# Patient Record
Sex: Female | Born: 1965 | Race: White | Hispanic: No | Marital: Married | Smoking: Current every day smoker
Health system: Southern US, Community
[De-identification: ages and names within clinical notes are randomized; demographics above are authoritative.]

## PROBLEM LIST (undated history)

## (undated) DIAGNOSIS — R569 Unspecified convulsions: Secondary | ICD-10-CM

## (undated) DIAGNOSIS — M069 Rheumatoid arthritis, unspecified: Secondary | ICD-10-CM

## (undated) DIAGNOSIS — M329 Systemic lupus erythematosus, unspecified: Secondary | ICD-10-CM

## (undated) DIAGNOSIS — M3500 Sicca syndrome, unspecified: Secondary | ICD-10-CM

## (undated) HISTORY — PX: ABDOMINAL HYSTERECTOMY: SHX81

## (undated) HISTORY — PX: OVARIAN CYST REMOVAL: SHX89

## (undated) HISTORY — PX: APPENDECTOMY: SHX54

---

## 2013-06-27 ENCOUNTER — Emergency Department (HOSPITAL_BASED_OUTPATIENT_CLINIC_OR_DEPARTMENT_OTHER)
Admission: EM | Admit: 2013-06-27 | Discharge: 2013-06-27 | Disposition: A | Discharge status: Home / Self Care | Payer: 59 | Attending: Emergency Medicine | Admitting: Emergency Medicine

## 2013-06-27 ENCOUNTER — Encounter (HOSPITAL_BASED_OUTPATIENT_CLINIC_OR_DEPARTMENT_OTHER): Payer: Self-pay | Admitting: Emergency Medicine

## 2013-06-27 DIAGNOSIS — M545 Low back pain, unspecified: Secondary | ICD-10-CM | POA: Insufficient documentation

## 2013-06-27 DIAGNOSIS — M549 Dorsalgia, unspecified: Secondary | ICD-10-CM

## 2013-06-27 DIAGNOSIS — G40909 Epilepsy, unspecified, not intractable, without status epilepticus: Secondary | ICD-10-CM | POA: Insufficient documentation

## 2013-06-27 DIAGNOSIS — F17200 Nicotine dependence, unspecified, uncomplicated: Secondary | ICD-10-CM | POA: Insufficient documentation

## 2013-06-27 DIAGNOSIS — Z8739 Personal history of other diseases of the musculoskeletal system and connective tissue: Secondary | ICD-10-CM | POA: Insufficient documentation

## 2013-06-27 HISTORY — DX: Unspecified convulsions: R56.9

## 2013-06-27 HISTORY — DX: Systemic lupus erythematosus, unspecified: M32.9

## 2013-06-27 HISTORY — DX: Sjogren syndrome, unspecified: M35.00

## 2013-06-27 HISTORY — DX: Rheumatoid arthritis, unspecified: M06.9

## 2013-06-27 MED ORDER — OXYCODONE-ACETAMINOPHEN 5-325 MG PO TABS: 2.0000 | ORAL_TABLET | ORAL | Status: DC | PRN

## 2013-06-27 MED ORDER — METHOCARBAMOL 500 MG PO TABS: 500.0000 mg | ORAL_TABLET | Freq: Two times a day (BID) | ORAL | Status: DC

## 2013-06-27 NOTE — ED Notes (Signed)
PA at bedside.

## 2013-06-27 NOTE — ED Notes (Signed)
Pt reports chronic joint problems.  Is currently in the process of moving from out of [place] and today with lower back pain with radiation up to mid thoracic area.  Tingling in back on left side buttocks, pain radiation to same.  Denies uncontrolled loss of bowel/bladder.

## 2013-06-27 NOTE — ED Provider Notes (Signed)
CSN: [id number]     Arrival date & time 06/27/13  1859 History   First MD Initiated Contact with Patient 06/27/13 1927     Chief Complaint  Patient presents with  . Back Pain   (Consider location/radiation/quality/duration/timing/severity/associated sxs/prior Treatment) Patient is a 47 y.o. female presenting with back pain. The history is provided by the patient.  Back Pain Location:  Lumbar spine Quality:  Aching Radiates to:  Does not radiate Pain severity:  Moderate Pain is:  Same all the time Onset quality:  Gradual Progression:  Worsening Chronicity:  New Relieved by:  Nothing Worsened by:  Nothing tried Ineffective treatments:  None tried   Past Medical History  Diagnosis Date  . SLE (systemic lupus erythematosus)   . Sjoegren syndrome   . Rheumatoid arthritis   . Seizures    Past Surgical History  Procedure Laterality Date  . Appendectomy    . Abdominal hysterectomy    . Ovarian cyst removal     No family history on file. History  Substance Use Topics  . Smoking status: Current Every Day Smoker -- 0.50 packs/day    Types: Cigarettes  . Smokeless tobacco: Not on file  . Alcohol Use: No   OB History   Grav Para Term Preterm Abortions TAB SAB Ect Mult Living                 Review of Systems  Musculoskeletal: Positive for back pain.  All other systems reviewed and are negative.    Allergies  Codeine  Home Medications   Current Outpatient Rx  Name  Route  Sig  Dispense  Refill  . DULoxetine (CYMBALTA) 20 MG capsule   Oral   Take 20 mg by mouth daily.         Marland Kitchen levETIRAcetam (KEPPRA) 500 MG tablet   Oral   Take 500 mg by mouth every 12 (twelve) hours.         . RABEprazole (ACIPHEX) 20 MG tablet   Oral   Take 20 mg by mouth daily.         . methocarbamol (ROBAXIN) 500 MG tablet   Oral   Take 1 tablet (500 mg total) by mouth 2 (two) times daily.   20 tablet   0   . oxyCODONE-acetaminophen (PERCOCET/ROXICET) 5-325 MG per tablet  Oral   Take 2 tablets by mouth every 4 (four) hours as needed.   16 tablet   0    BP 130/75  Pulse 94  Temp(Src) 98.2 F (36.8 C) (Oral)  Resp 18  Ht 5\' 6"  (1.676 m)  Wt 140 lb (63.504 kg)  BMI 22.61 kg/m2  SpO2 100% Physical Exam  Nursing note and vitals reviewed. Constitutional: She is oriented to person, place, and time. She appears well-developed and well-nourished.  HENT:  Head: Normocephalic.  Right Ear: External ear normal.  Left Ear: External ear normal.  Neck: Normal range of motion.  Cardiovascular: Normal rate.   Pulmonary/Chest: Effort normal.  Abdominal: Soft.  Genitourinary: Vagina normal.  Musculoskeletal: Normal range of motion.  Tender ls spine diffusely  Neurological: She is alert and oriented to person, place, and time.  Skin: Skin is warm.  Psychiatric: She has a normal mood and affect.    ED Course  Procedures (including critical care time) Labs Review Labs Reviewed - No data to display Imaging Review No results found.  EKG Interpretation   None       MDM   1. Back pain  Robaxin and percocet.   Pt advised to follow up with Dr. Pearletha Forge for recheck in 1 week if pain persist    Elson Areas, [place] 06/27/13 1958

## 2013-06-27 NOTE — ED Notes (Signed)
Throbbing back pain radiates to upper back and left buttocks. Onset yesterday, also moving, lifting heavy boxes.

## 2013-06-28 NOTE — ED Provider Notes (Signed)
Medical screening examination/treatment/procedure(s) were performed by non-physician practitioner and as supervising physician I was immediately available for consultation/collaboration.    Candyce Churn, MD 06/28/13 [phone]

## 2013-08-19 ENCOUNTER — Ambulatory Visit (INDEPENDENT_AMBULATORY_CARE_PROVIDER_SITE_OTHER): Payer: 59 | Admitting: Family Medicine

## 2013-08-19 VITALS — BP 104/70 | HR 98 | Temp 98.2°F | Resp 16 | Ht 65.5 in | Wt 144.8 lb

## 2013-08-19 DIAGNOSIS — M797 Fibromyalgia: Secondary | ICD-10-CM

## 2013-08-19 DIAGNOSIS — L930 Discoid lupus erythematosus: Secondary | ICD-10-CM

## 2013-08-19 DIAGNOSIS — G894 Chronic pain syndrome: Secondary | ICD-10-CM

## 2013-08-19 DIAGNOSIS — G40909 Epilepsy, unspecified, not intractable, without status epilepticus: Secondary | ICD-10-CM

## 2013-08-19 DIAGNOSIS — K219 Gastro-esophageal reflux disease without esophagitis: Secondary | ICD-10-CM

## 2013-08-19 DIAGNOSIS — IMO0001 Reserved for inherently not codable concepts without codable children: Secondary | ICD-10-CM

## 2013-08-19 MED ORDER — OXYMORPHONE HCL ER 30 MG PO TB12: 30.0000 mg | ORAL_TABLET | Freq: Two times a day (BID) | ORAL | Status: AC

## 2013-08-19 MED ORDER — OXYCODONE-ACETAMINOPHEN 5-325 MG PO TABS: ORAL_TABLET | ORAL | Status: DC

## 2013-08-19 MED ORDER — RABEPRAZOLE SODIUM 20 MG PO TBEC: 20.0000 mg | DELAYED_RELEASE_TABLET | Freq: Every day | ORAL | Status: AC

## 2013-08-19 MED ORDER — LEVETIRACETAM 500 MG PO TABS: 500.0000 mg | ORAL_TABLET | Freq: Two times a day (BID) | ORAL | Status: AC

## 2013-08-19 MED ORDER — DULOXETINE HCL 20 MG PO CPEP: 20.0000 mg | ORAL_CAPSULE | Freq: Every day | ORAL | Status: DC

## 2013-08-19 NOTE — Patient Instructions (Addendum)
Take your medicines only as directed  Return in 2 weeks and bring the records from your previous physician or contact him and get records sent to this office  Bring in medicine bottles being used currently and the ones that I prescribed for you  Referral is being made to a chronic pain physician and to a rheumatologist

## 2013-08-19 NOTE — Progress Notes (Signed)
Subjective: Patient is here for a refill her medications. She has a long history of seizure disorder controlled with Keppra. She carries a diagnosis of lupus and fibromyalgia. She has been under the care of her chronic pain clinic in [place]. She moved down here 3 months ago because her husband is moving his company here. She has used up her pain medications, has one-day worth left, and had an appointment with Dr. Althea Charon today. She went to see him clinically he will be a primary care potential for her, but he told her that is not his job and he suggested she might come over here. She says she ihas copies of her records from [place] at home. Initially she informed me that she had been just taking the medicines and Dr. in [place] he prescribed for her, then wean question about the medicine use based on the controlled substance database she did admit to having gotten medicines at several sources in this area also.  Objective: Chest clear. Heart regular without murmurs. Tender in her neck and upper back. Tender in the low back primarily. Straight leg raising test positive at about 40-50 bilaterally. Complains of of pain on movement of the hips and knees.  Assessment: Seizure disorder GERD Fibromyalgia Chronic pain syndrome History of lupus  Plan: There were several inconsistencies in her pain medicine history and use. These include that she has gotten things under 2 different names(Rhonda and Calena), she is used Armed forces technical officer, she's been to multiple doctors, she is used things faster than prescribed, and she was not straight forward in telling me everything. However I do believe that she has had more pain with the move. I have informed her that I cannot be long-term prescriber pain medicines for her. I will refer her to rheumatology and a pain clinic. I made the decision to give HER-2 weeks' worth of pain medications until she brings me her records from her previous physician. She also needs to  bring in all her medicine bottles.  I will not be her long-term pain medicine prescribe her. I can take care of other medical needs.

## 2013-08-26 ENCOUNTER — Telehealth: Payer: Self-pay | Admitting: Radiology

## 2013-08-26 NOTE — Telephone Encounter (Signed)
[facility] medical needs records from her previous rheumatologist in order to schedule the appointment for her. I have advised patient and have given her the phone number for the rheumatology office as well as their fax number. The office states they will make appt once they have the records. To Dr Pollie Friar

## 2013-08-30 NOTE — Telephone Encounter (Signed)
Noted  

## 2015-06-28 ENCOUNTER — Encounter (HOSPITAL_COMMUNITY): Payer: Self-pay | Admitting: Emergency Medicine

## 2015-06-28 ENCOUNTER — Emergency Department (HOSPITAL_COMMUNITY)
Admission: EM | Admit: 2015-06-28 | Discharge: 2015-06-28 | Disposition: A | Discharge status: Home / Self Care | Payer: BLUE CROSS/BLUE SHIELD | Attending: Emergency Medicine | Admitting: Emergency Medicine

## 2015-06-28 DIAGNOSIS — Z79899 Other long term (current) drug therapy: Secondary | ICD-10-CM | POA: Diagnosis not present

## 2015-06-28 DIAGNOSIS — F1310 Sedative, hypnotic or anxiolytic abuse, uncomplicated: Secondary | ICD-10-CM | POA: Insufficient documentation

## 2015-06-28 DIAGNOSIS — Z7982 Long term (current) use of aspirin: Secondary | ICD-10-CM | POA: Diagnosis not present

## 2015-06-28 DIAGNOSIS — G40909 Epilepsy, unspecified, not intractable, without status epilepticus: Secondary | ICD-10-CM | POA: Diagnosis not present

## 2015-06-28 DIAGNOSIS — G8929 Other chronic pain: Secondary | ICD-10-CM | POA: Diagnosis not present

## 2015-06-28 DIAGNOSIS — Z720 Tobacco use: Secondary | ICD-10-CM | POA: Diagnosis not present

## 2015-06-28 DIAGNOSIS — Z008 Encounter for other general examination: Secondary | ICD-10-CM | POA: Diagnosis present

## 2015-06-28 DIAGNOSIS — M329 Systemic lupus erythematosus, unspecified: Secondary | ICD-10-CM | POA: Insufficient documentation

## 2015-06-28 DIAGNOSIS — M069 Rheumatoid arthritis, unspecified: Secondary | ICD-10-CM | POA: Insufficient documentation

## 2015-06-28 DIAGNOSIS — R69 Illness, unspecified: Secondary | ICD-10-CM

## 2015-06-28 LAB — COMPREHENSIVE METABOLIC PANEL
ALT: 21 U/L (ref 14–54)
AST: 22 U/L (ref 15–41)
Albumin: 4.3 g/dL (ref 3.5–5.0)
Alkaline Phosphatase: 66 U/L (ref 38–126)
Anion gap: 6 (ref 5–15)
BUN: 8 mg/dL (ref 6–20)
CO2: 30 mmol/L (ref 22–32)
Calcium: 9.1 mg/dL (ref 8.9–10.3)
Chloride: 103 mmol/L (ref 101–111)
Creatinine, Ser: 0.8 mg/dL (ref 0.44–1.00)
GFR calc Af Amer: >60 mL/min (ref >60)
GFR calc non Af Amer: >60 mL/min (ref >60)
Glucose, Bld: 93 mg/dL (ref 65–99)
Potassium: 3.8 mmol/L (ref 3.5–5.1)
Sodium: 139 mmol/L (ref 135–145)
Total Bilirubin: 0.7 mg/dL (ref 0.3–1.2)
Total Protein: 7.3 g/dL (ref 6.5–8.1)

## 2015-06-28 LAB — CBC
HCT: 41.9 % (ref 36.0–46.0)
Hemoglobin: 13.6 g/dL (ref 12.0–15.0)
MCH: 31.3 pg (ref 26.0–34.0)
MCHC: 32.5 g/dL (ref 30.0–36.0)
MCV: 96.3 fL (ref 78.0–100.0)
Platelets: 209 10*3/uL (ref 150–400)
RBC: 4.35 MIL/uL (ref 3.87–5.11)
RDW: 13.2 % (ref 11.5–15.5)
WBC: 6 10*3/uL (ref 4.0–10.5)

## 2015-06-28 LAB — ETHANOL: Alcohol, Ethyl (B): <5 mg/dL (ref <5)

## 2015-06-28 LAB — RAPID URINE DRUG SCREEN, HOSP PERFORMED
Amphetamines: NOT DETECTED
Barbiturates: NOT DETECTED
Benzodiazepines: POSITIVE — AB
Cocaine: NOT DETECTED
Opiates: NOT DETECTED
Tetrahydrocannabinol: NOT DETECTED

## 2015-06-28 LAB — ACETAMINOPHEN LEVEL: Acetaminophen (Tylenol), Serum: <10 ug/mL — ABNORMAL LOW (ref 10–30)

## 2015-06-28 LAB — SALICYLATE LEVEL: Salicylate Lvl: <4 mg/dL (ref 2.8–30.0)

## 2015-06-28 NOTE — Discharge Instructions (Signed)
°Emergency Department Resource Guide °1) Find a Doctor and Pay Out of Pocket °Although you won't have to find out who is covered by your insurance plan, it is a good idea to ask around and get recommendations. You will then need to call the office and see if the doctor you have chosen will accept you as a new patient and what types of options they offer for patients who are self-pay. Some doctors offer discounts or will set up payment plans for their patients who do not have insurance, but you will need to ask so you aren't surprised when you get to your appointment. ° °2) Contact Your Local Health Department °Not all health departments have doctors that can see patients for sick visits, but many do, so it is worth a call to see if yours does. If you don't know where your local health department is, you can check in your phone book. The CDC also has a tool to help you locate your state's health department, and many state websites also have listings of all of their local health departments. ° °3) Find a Walk-in Clinic °If your illness is not likely to be very severe or complicated, you may want to try a walk in clinic. These are popping up all over the country in pharmacies, drugstores, and shopping centers. They're usually staffed by nurse practitioners or physician assistants that have been trained to treat common illnesses and complaints. They're usually fairly quick and inexpensive. However, if you have serious medical issues or chronic medical problems, these are probably not your best option. ° °No Primary Care Doctor: °- Call Health Connect at  832-8000 - they can help you locate a primary care doctor that  accepts your insurance, provides certain services, etc. °- Physician Referral Service- 1-800-533-3463 ° °Chronic Pain Problems: °Organization         Address  Phone   Notes  °[facility]  (336) 297-2271 Patients need to be referred by their primary care doctor.  ° °Medication  Assistance: °Organization         Address  Phone   Notes  °[place] [facility] [address] °[place], [place] [address] (336) 641-8030 --Must be a resident of [place] °-- Must have NO insurance coverage whatsoever (no Medicaid/ Medicare, etc.) °-- The pt. MUST have a primary care doctor that directs their care regularly and follows them in the community °  °[facility]  (866) 331-1348   °[employer]  (888) 892-1162   ° °Agencies that provide inexpensive medical care: °Organization         Address  Phone   Notes  °[facility]  (336) 832-8035   ° Internal Medicine    (336) 832-7272   °[facility] [address] °[place], [place] [address] (336) 832-4777   °[facility] [address], °[place] (336) 271-4999   °[facility]    (336) 373-0678   °[facility]    (336) 272-1050   °[facility] ° [address], [place] Phone:  (336) 832-4444, Fax:  (336) 832-4440 Hours of Operation:  9 am - 6 pm, M-F.  Also accepts Medicaid/Medicare and self-pay.  °[facility] ° [address], [place] Phone: (336) 832-3150, Fax: (336) 832-3151. Hours of Operation:  8:30 am - 5:30 pm, M-F.  Also accepts Medicaid and self-pay.  °[facility] [place] [address], [place] Phone: (336) 878-6027   °[facility] [address], [place], [place] (336)723-1848, Ext. 123 Mondays & Thursdays: 7-9 AM.  First 15 patients are seen on a first come, first serve basis. °  ° °Medicaid-accepting [place] Providers: ° °Organization         Address  Phone   Notes  °[facility] [address], [place] (336) 641-2100 Also accepts self-pay patients.  °[facility] [address], [place] ° (336) 856-9996   °[facility] [address], [place]  (336) 288-8857   °[facility] [address] [place] [address], [place] (336) 299-7000   °Veita Bland [address], [place]  ° (336) 373-1557 Only accepts [place] Access Medicaid patients after they have their name applied to their card.  ° °Self-Pay (no insurance) in [place]: ° °Organization         Address  Phone   Notes  °Sickle Cell Patients, [facility] [address], [place] (336) 832-1970   °[facility] [address], [place] (336) 832-4400   °[facility] [place] ° [address], [place] (336) 992-4800   °[facility]/Dr. Osei-Bonsu ° [address] [place] [address], [place] or [address], [place] (336) 841-8500 Phone number for both [place] and [place] locations is the same.  °[facility] [address], [place] (336) 299-0000   °[facility] [address] [place] [address], [place] or [address] (336) 852-7530 °(336) 878-2260   °[facility] [address], [place] (336) 350-1642, phone; (336) 294-5005, fax Sees patients 1st and 3rd Saturday of every month.  Must not qualify for public or private insurance (i.e. Medicaid, Medicare, [place] Health Choice, Veterans' Benefits) • Household income should be no more than 200% of the poverty level •The clinic cannot treat you if you are pregnant or think you are pregnant • Sexually transmitted diseases are not treated at the clinic.  ° ° °Dental Care: °Organization         Address  Phone  Notes  °[place] [facility] [address], [place] (336) 641-6152 Accepts children up to age 21 who are enrolled in Medicaid or [place] Health Choice; pregnant women with a Medicaid card; and children who have applied for Medicaid or [place] Health Choice, but were declined, whose parents can pay a reduced fee at time of service.  °[place]  [facility] [place]  [address], [place] (336) 641-7733 Accepts children up to age 21 who are enrolled in Medicaid or [place] Health Choice; pregnant women with a Medicaid card; and children who have applied for Medicaid or [place] Health Choice, but were declined, whose parents can pay a reduced fee at time of service.  °[facility] ° [address], [place] (336) 641-4533 Patients are seen by appointment only. Walk-ins are not accepted. [facility] will see patients 18 years of age and older. °Monday - Tuesday (8am-5pm) °Most Wednesdays (8:30-5pm) °$30 per visit, cash only  °[facility] ° [address], [place] (336) 641-4533 Patients are seen by appointment only. Walk-ins are not accepted. [facility] will see patients 18 years of age and older. °One   Wednesday Evening (Monthly: Volunteer Based).  $30 per visit, cash only  °[facility]  (919) 537-3737 for adults; Children under age 4, call Graduate Pediatric Dentistry at (919) [phone]. Children aged 4-14, please call (919) [phone] to request a pediatric application. ° Dental services are provided in all areas of dental care including fillings, crowns and bridges, complete and partial dentures, implants, gum treatment, root canals, and extractions. Preventive care is also provided. Treatment is provided to both adults and children. °Patients are selected via a lottery and there is often a waiting list. °  °[facility] [address], °[place] ° (336) 763-8833 [email or web address] °  °[facility] [address], [place], [place] (336)723-1848, Ext. 123 Second and Fourth Thursday of each month, opens at 6:30 AM; Clinic ends at 9 AM.  Patients are seen on a first-come first-served basis, and a limited number are seen during each clinic.  ° °[facility] ° [address], [place], [place] (336) 723-7904    Eligibility Requirements °You must have lived in [place], [place], or [place] counties for at least the last three months. °  You cannot be eligible for state or federal sponsored healthcare insurance, including Veterans Administration, Medicaid, or Medicare. °  You generally cannot be eligible for healthcare insurance through your employer.  °  How to apply: °Eligibility screenings are held every Tuesday and Wednesday afternoon from 1:00 pm until 4:00 pm. You do not need an appointment for the interview!  °[facility] [address], [place], [place] 336-631-2330   °[facility]  336-342-8273   °[facility]  336-703-3100   °[facility]  336-570-6415   ° °Behavioral Health Resources in the Community: °Intensive Outpatient Programs °Organization         Address  Phone  Notes  °[facility] [address], [place], [place] 336-878-6098   °[facility] [address], [place], [place] 336-832-9800   °[facility] [address], [place], [place] ° 336-882-2125   °[facility] [address],  °[place], [place] 1-800-853-5163 or 336-641-4981   °Substance Abuse Resources °Organization         Address  Phone  Notes  °[facility]  336-882-2125   °[facility]  336-784-9470   °[facility]  336-285-9073   °[facility]  336-845-3988   °Residential & Outpatient Substance Abuse Program  1-800-659-3381   °Psychological Services °Organization         Address  Phone  Notes  °[facility]  336- 832-9600   °[facility]  336- 378-7881   °[facility] [address], [place] 1-800-853-5163 or 336-641-4981   ° °Mobile Crisis Teams °Organization         Address  Phone  Notes  °[facility], Mobile Crisis Care Unit  1-877-626-1772   °Assertive °[facility] ° [address]  [place], [place] 336-834-9664   °Sharon DeEsch [address] °[place] 336-554-5454   ° °Self-Help/Support Groups °Organization         Address  Phone             Notes  °[facility] [place] - variety of support groups  336- 373-1402 Call for more information  °Narcotics Anonymous (NA), [facility] [address], °[place]  2 meetings at this location  ° °  Residential Treatment Programs °Organization         Address  Phone  Notes  °[facility] [address],    °[place]  1-866-801-8205   °[facility] ° [address], [place], [place] 704-293-8524   °[facility] [address], [place] 336-845-3988 Admissions: 8am-3pm M-F  °[facility] [address]    °[place], [place] 336-841-1104   °[facility] [address], [place], [place] 336-379-7146   °[facility] [address]  °[place], [place] 336-285-9073   °[facility] Intensive Outpatient [address], [place], [place] 336-852-3033   °[facility] [address]  °[place], [place] 1-877-615-2722 or 336-784-9470   °[facility] [address] [place], [place] 336-227-7417 Accepts Medicaid  °[facility] [address]  °[place] 1-800-659-3381 Substance Abuse/Addiction Treatment  ° °[place] Behavioral Health Resources °Organization         Address  Phone  Notes  °[facility]  (888) 581-9988   °Julie Brannon, PhD [address] [place], [place]   (336) 349-5553 or (336) 951-0000   ° Behavioral   [address] °[place], [place] (336) 349-4454   °[facility] [address], [place], [place] (336) 342-8316 Insurance/Medicaid/sponsorship through [facility]  °[facility] [address]                                    [place], [place] (336) 342-8316 Therapy/tele-psych/case    °[facility] [address]  ° [place], [place] (336) 349-2233    °Dr. Arfeen  (336) 349-4544   °[facility] [place]  [employer] [place] [facility] 1) [address], [place] °2) [address], [place] °3)  [address], [place] (336) 349-3220 °(336) 342-7768 ° °(336) 342-8140   °[place] [facility] (336) 342-1394 or (336) 342-3537 (After Hours)    ° ° °

## 2015-06-28 NOTE — ED Notes (Signed)
Pt husband states the patient has not been able to have a conversation with him and that she's been "high as a kite." Hx of prescription drug issues in the past, states she has a cabinet full of drugs, rips the labels off and switches the bottles so that he doesn't know which pills they are. Pt slurring speech, difficulty finding words, frustrated easily, walks with a widened gate, and they state this is her normal for the last twenty years. As the husband is telling different stories the patient is raising her voice stating "THAT'S NOT TRUE." Husband states she has a history of lying about her drug problem.

## 2015-07-08 NOTE — ED Provider Notes (Signed)
CSN: [id number]     Arrival date & time 06/28/15  1708 History   First MD Initiated Contact with Patient 06/28/15 1819     Chief Complaint  Patient presents with  . Drug Problem  . Medical Clearance     (Consider location/radiation/quality/duration/timing/severity/associated sxs/prior Treatment) HPI   49yF brought in for evaluation. Apparently husband feels she is abusing prescription drugs. She has chronic pain. She denies inappropriate use. Denies illicit drug use. No si or hi. No hallucinations. No acute complaints.   Past Medical History  Diagnosis Date  . SLE (systemic lupus erythematosus) (HCC)   . Sjoegren syndrome (HCC)   . Rheumatoid arthritis (HCC)   . Seizures [facility])    Past Surgical History  Procedure Laterality Date  . Appendectomy    . Abdominal hysterectomy    . Ovarian cyst removal     Family History  Problem Relation Age of Onset  . Cancer Mother   . Cancer Father   . Heart disease Father   . Hyperlipidemia Father   . Hypertension Father   . Cancer Maternal Grandmother   . Diabetes Paternal Grandmother   . Hyperlipidemia Paternal Grandmother   . Stroke Paternal Grandfather   . Heart disease Paternal Grandfather    Social History  Substance Use Topics  . Smoking status: Current Every Day Smoker -- 0.50 packs/day    Types: Cigarettes  . Smokeless tobacco: None  . Alcohol Use: No   OB History    No data available     Review of Systems  All systems reviewed and negative, other than as noted in HPI.   Allergies  Codeine  Home Medications   Prior to Admission medications   Medication Sig Start Date End Date Taking? Authorizing Provider  aspirin 325 MG tablet Take 650-1,300 mg by mouth 2 (two) times daily as needed for moderate pain or headache.   Yes Historical Provider, MD  citalopram (CELEXA) 10 MG tablet Take 10 mg by mouth daily. 06/02/15  Yes Historical Provider, MD  clonazePAM (KLONOPIN) 0.5 MG tablet Take 0.5 mg by mouth 2 (two) times  daily. 06/16/15  Yes Historical Provider, MD  gabapentin (NEURONTIN) 300 MG capsule Take 300 mg by mouth 2 (two) times daily. 06/02/15  Yes Historical Provider, MD  ibuprofen (ADVIL,MOTRIN) 800 MG tablet Take 800 mg by mouth 2 (two) times daily as needed for headache or moderate pain.  06/16/15  Yes Historical Provider, MD  levETIRAcetam (KEPPRA) 500 MG tablet Take 1 tablet (500 mg total) by mouth every 12 (twelve) hours. 08/19/13  Yes Peyton Najjar, MD  ondansetron (ZOFRAN) 8 MG tablet Take 8 mg by mouth 3 (three) times daily as needed for nausea.  06/16/15  Yes Historical Provider, MD  oxymetazoline (AFRIN) 0.05 % nasal spray Place 2 sprays into both nostrils 2 (two) times daily as needed for congestion.   Yes Historical Provider, MD  Probiotic CAPS Take 1 capsule by mouth daily.   Yes Historical Provider, MD  RABEprazole (ACIPHEX) 20 MG tablet Take 1 tablet (20 mg total) by mouth daily. 08/19/13  Yes Peyton Najjar, MD  sennosides-docusate sodium (SENOKOT-S) 8.6-50 MG tablet Take 2 tablets by mouth 2 (two) times daily as needed for constipation.   Yes Historical Provider, MD  SUBOXONE 8-2 MG FILM Place 1 Film under the tongue 2 (two) times daily.  06/08/15  Yes Historical Provider, MD  Tapentadol HCl (NUCYNTA) 100 MG TABS Take 100-200 mg by mouth 3 (three) times daily as needed (  pain).  09/30/14  Yes Historical Provider, MD  temazepam (RESTORIL) 30 MG capsule Take 30 mg by mouth at bedtime as needed. for sleep 06/16/15  Yes Historical Provider, MD  tiZANidine (ZANAFLEX) 4 MG tablet Take 4 mg by mouth 3 (three) times daily as needed for muscle spasms.  06/01/15  Yes Historical Provider, MD  triamcinolone cream (KENALOG) 0.1 % Apply 1 application topically 3 (three) times daily as needed. 03/25/14  Yes Historical Provider, MD  Vortioxetine HBr (BRINTELLIX) 20 MG TABS Take 20 mg by mouth daily. 04/27/15  Yes Historical Provider, MD  DULoxetine (CYMBALTA) 20 MG capsule Take 1 capsule (20 mg total) by mouth  daily. Patient not taking: Reported on 06/28/2015 08/19/13   Peyton Najjar, MD  methocarbamol (ROBAXIN) 500 MG tablet Take 1 tablet (500 mg total) by mouth 2 (two) times daily. Patient not taking: Reported on 06/28/2015 06/27/13   Elson Areas, PA-C  oxyCODONE-acetaminophen (PERCOCET/ROXICET) 5-325 MG per tablet Take 1 qid for pain 08/19/13   Peyton Najjar, MD  oxymorphone (OPANA ER) 30 MG 12 hr tablet Take 1 tablet (30 mg total) by mouth every 12 (twelve) hours. 08/19/13   Peyton Najjar, MD   BP 112/59 mmHg  Pulse 66  Temp(Src) 98.3 F (36.8 C) (Oral)  Resp 18  SpO2 97% Physical Exam  Constitutional: She appears well-developed and well-nourished. No distress.  HENT:  Head: Normocephalic and atraumatic.  Eyes: Conjunctivae are normal. Right eye exhibits no discharge. Left eye exhibits no discharge.  Neck: Neck supple.  Cardiovascular: Normal rate, regular rhythm and normal heart sounds.  Exam reveals no gallop and no friction rub.   No murmur heard. Pulmonary/Chest: Effort normal and breath sounds normal. No respiratory distress.  Abdominal: Soft. She exhibits no distension. There is no tenderness.  Musculoskeletal: She exhibits no edema or tenderness.  Neurological: She is alert.  Skin: Skin is warm and dry.  Psychiatric: She has a normal mood and affect. Her behavior is normal. Thought content normal.  Nursing note and vitals reviewed.   ED Course  Procedures (including critical care time) Labs Review Labs Reviewed  ACETAMINOPHEN LEVEL - Abnormal; Notable for the following:    Acetaminophen (Tylenol), Serum <10 (*)    All other components within normal limits  URINE RAPID DRUG SCREEN, HOSP PERFORMED - Abnormal; Notable for the following:    Benzodiazepines POSITIVE (*)    All other components within normal limits  COMPREHENSIVE METABOLIC PANEL  ETHANOL  SALICYLATE LEVEL  CBC    Imaging Review No results found. I have personally reviewed and evaluated these  images and lab results as part of my medical decision-making.   EKG Interpretation None      MDM   Final diagnoses:  Chronic pain  Psychiatric diagnosis deferred    49yf with chronic pain. Husband feels she is abusing medications. She denies. No si or hi. Not psychotic. She is requesting to leave. i have no basis to ivc her and she denies any acute complaints to me.    Raeford Razor, MD 07/08/15 2212

## 2015-07-27 ENCOUNTER — Observation Stay (HOSPITAL_COMMUNITY): Admission: AD | Admit: 2015-07-27 | Payer: BLUE CROSS/BLUE SHIELD | Source: Intra-hospital | Admitting: Psychiatry

## 2015-07-27 ENCOUNTER — Encounter (HOSPITAL_COMMUNITY): Payer: Self-pay | Admitting: Emergency Medicine

## 2015-07-27 ENCOUNTER — Emergency Department (HOSPITAL_COMMUNITY)
Admission: EM | Admit: 2015-07-27 | Discharge: 2015-07-27 | Discharge status: Against Medical Advice | Payer: BLUE CROSS/BLUE SHIELD | Attending: Emergency Medicine | Admitting: Emergency Medicine

## 2015-07-27 DIAGNOSIS — F1110 Opioid abuse, uncomplicated: Secondary | ICD-10-CM | POA: Diagnosis not present

## 2015-07-27 DIAGNOSIS — R443 Hallucinations, unspecified: Secondary | ICD-10-CM | POA: Diagnosis present

## 2015-07-27 DIAGNOSIS — Z7982 Long term (current) use of aspirin: Secondary | ICD-10-CM | POA: Diagnosis not present

## 2015-07-27 DIAGNOSIS — M069 Rheumatoid arthritis, unspecified: Secondary | ICD-10-CM | POA: Insufficient documentation

## 2015-07-27 DIAGNOSIS — F1310 Sedative, hypnotic or anxiolytic abuse, uncomplicated: Secondary | ICD-10-CM | POA: Insufficient documentation

## 2015-07-27 DIAGNOSIS — G894 Chronic pain syndrome: Secondary | ICD-10-CM

## 2015-07-27 DIAGNOSIS — F39 Unspecified mood [affective] disorder: Secondary | ICD-10-CM

## 2015-07-27 DIAGNOSIS — Z79899 Other long term (current) drug therapy: Secondary | ICD-10-CM | POA: Diagnosis not present

## 2015-07-27 DIAGNOSIS — F17210 Nicotine dependence, cigarettes, uncomplicated: Secondary | ICD-10-CM | POA: Diagnosis not present

## 2015-07-27 DIAGNOSIS — G8929 Other chronic pain: Secondary | ICD-10-CM | POA: Diagnosis not present

## 2015-07-27 LAB — CBC
HCT: 44.6 % (ref 36.0–46.0)
HEMOGLOBIN: 14.7 g/dL (ref 12.0–15.0)
MCH: 31.3 pg (ref 26.0–34.0)
MCHC: 33 g/dL (ref 30.0–36.0)
MCV: 94.9 fL (ref 78.0–100.0)
Platelets: 243 10*3/uL (ref 150–400)
RBC: 4.7 MIL/uL (ref 3.87–5.11)
RDW: 12.5 % (ref 11.5–15.5)
WBC: 6.2 10*3/uL (ref 4.0–10.5)

## 2015-07-27 LAB — SALICYLATE LEVEL: Salicylate Lvl: <4 mg/dL (ref 2.8–30.0)

## 2015-07-27 LAB — RAPID URINE DRUG SCREEN, HOSP PERFORMED
AMPHETAMINES: NOT DETECTED
BARBITURATES: NOT DETECTED
Benzodiazepines: POSITIVE — AB
Cocaine: NOT DETECTED
Opiates: POSITIVE — AB
TETRAHYDROCANNABINOL: NOT DETECTED

## 2015-07-27 LAB — COMPREHENSIVE METABOLIC PANEL
ALBUMIN: 5 g/dL (ref 3.5–5.0)
ALT: 27 U/L (ref 14–54)
ANION GAP: 8 (ref 5–15)
AST: 23 U/L (ref 15–41)
Alkaline Phosphatase: 70 U/L (ref 38–126)
BILIRUBIN TOTAL: 0.6 mg/dL (ref 0.3–1.2)
BUN: 9 mg/dL (ref 6–20)
CHLORIDE: 102 mmol/L (ref 101–111)
CO2: 28 mmol/L (ref 22–32)
Calcium: 9.4 mg/dL (ref 8.9–10.3)
Creatinine, Ser: 0.78 mg/dL (ref 0.44–1.00)
GFR calc Af Amer: >60 mL/min (ref >60)
GFR calc non Af Amer: >60 mL/min (ref >60)
Glucose, Bld: 100 mg/dL — ABNORMAL HIGH (ref 65–99)
Potassium: 3.9 mmol/L (ref 3.5–5.1)
SODIUM: 138 mmol/L (ref 135–145)
Total Protein: 8 g/dL (ref 6.5–8.1)

## 2015-07-27 LAB — ACETAMINOPHEN LEVEL

## 2015-07-27 LAB — ETHANOL: Alcohol, Ethyl (B): <5 mg/dL (ref <5)

## 2015-07-27 MED ORDER — CLONAZEPAM 0.5 MG PO TABS
0.5000 mg | ORAL_TABLET | Freq: Two times a day (BID) | ORAL | Status: DC
  Administered 2015-07-27: 0.5 mg via ORAL
  Filled 2015-07-27: qty 1

## 2015-07-27 MED ORDER — GABAPENTIN 300 MG PO CAPS: 300.0000 mg | ORAL_CAPSULE | Freq: Four times a day (QID) | ORAL | Status: DC

## 2015-07-27 MED ORDER — CITALOPRAM HYDROBROMIDE 10 MG PO TABS
10.0000 mg | ORAL_TABLET | Freq: Every day | ORAL | Status: DC
  Administered 2015-07-27: 10 mg via ORAL
  Filled 2015-07-27: qty 1

## 2015-07-27 MED ORDER — GABAPENTIN 300 MG PO CAPS
300.0000 mg | ORAL_CAPSULE | Freq: Three times a day (TID) | ORAL | Status: DC
  Administered 2015-07-27: 300 mg via ORAL
  Filled 2015-07-27: qty 1

## 2015-07-27 MED ORDER — LEVETIRACETAM 500 MG PO TABS
500.0000 mg | ORAL_TABLET | Freq: Two times a day (BID) | ORAL | Status: DC
  Administered 2015-07-27: 500 mg via ORAL
  Filled 2015-07-27 (×2): qty 1

## 2015-07-27 MED ORDER — IBUPROFEN 800 MG PO TABS: 800.0000 mg | ORAL_TABLET | Freq: Two times a day (BID) | ORAL | Status: DC | PRN

## 2015-07-27 MED ORDER — VORTIOXETINE HBR 20 MG PO TABS
20.0000 mg | ORAL_TABLET | Freq: Every day | ORAL | Status: DC
  Administered 2015-07-27: 20 mg via ORAL
  Filled 2015-07-27: qty 20

## 2015-07-27 MED ORDER — BUPRENORPHINE HCL 8 MG SL SUBL
8.0000 mg | SUBLINGUAL_TABLET | Freq: Every day | SUBLINGUAL | Status: DC
  Administered 2015-07-27: 8 mg via SUBLINGUAL
  Filled 2015-07-27: qty 1

## 2015-07-27 MED ORDER — OXYCODONE HCL 5 MG PO TABS: 10.0000 mg | ORAL_TABLET | Freq: Three times a day (TID) | ORAL | Status: DC | PRN
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
## 2015-07-27 NOTE — Progress Notes (Signed)
Pt informed Cm her pcp is Dr Salvadore Farber at [facility] [address]

## 2015-07-27 NOTE — BH Assessment (Signed)
[facility] Assessment Progress Note  Per Thedore Mins, MD, this pt does not require psychiatric hospitalization, but would benefit from admission to the [facility] Observation Unit at this time.  [name], RN, [facility] has assigned pt to Obs 5.  However, when I spoke to the pt, whose husband was also present with pt's consent, she declined this option.  She agrees to accept referrals to area outpatient providers that offer both psychiatry and therapy.  Discharge instructions include referrals to the [facility] at [facility], [facility], and [facility].  Pt's nurse, Lincoln Maxin, has been notified.  Doylene Canning, MA Triage Specialist [phone]
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
## 2015-07-27 NOTE — ED Notes (Signed)
Pt has Hoodie,  sweater ,bra, under garment, underwear, pants, socks, and shoes.                                                                           Pt also has 3 rings( silver and blue crystal, wedding band,and gold wedding band), 2 sets of silver earrings, 1 watch, 5 bracelets, and 1 gold necklace w/ charm.   All jewelry has been given to security.                                           Marland Kitchen

## 2015-07-27 NOTE — Discharge Instructions (Addendum)
For your ongoing behavioral health needs you are advised to follow up with one of the follow practices.  All offer both psychiatry and therapy:       [facility] at [facility]      [address]      [place], [place] [address]      [phone]       [facility]      [facility]      [address]      [place], [place] [address]      [phone]        [facility]      [address]      [place], [place] [address]      [phone]   Chronic Pain Chronic pain can be defined as pain that is off and on and lasts for 3-6 months or longer. Many things cause chronic pain, which can make it difficult to make a diagnosis. There are many treatment options available for chronic pain. However, finding a treatment that works well for you may require trying various approaches until the right one is found. Many people benefit from a combination of two or more types of treatment to control their pain. SYMPTOMS  Chronic pain can occur anywhere in the body and can range from mild to very severe. Some types of chronic pain include:  Headache.  Low back pain.  Cancer pain.  Arthritis pain.  Neurogenic pain. This is pain resulting from damage to nerves. People with chronic pain may also have other symptoms such as:  Depression.  Anger.  Insomnia.  Anxiety. DIAGNOSIS  Your health care provider will help diagnose your condition over time. In many cases, the initial focus will be on excluding possible conditions that could be causing the pain. Depending on your symptoms, your health care provider may order tests to diagnose your condition. Some of these tests may include:   Blood tests.   CT scan.   MRI.   X-rays.   Ultrasounds.   Nerve conduction studies.  You may need to see a specialist.  TREATMENT  Finding treatment that works well may take time. You may be referred to a pain  specialist. He or she may prescribe medicine or therapies, such as:   Mindful meditation or yoga.  Shots (injections) of numbing or pain-relieving medicines into the spine or area of pain.  Local electrical stimulation.  Acupuncture.   Massage therapy.   Aroma, color, light, or sound therapy.   Biofeedback.   Working with a physical therapist to keep from getting stiff.   Regular, gentle exercise.   Cognitive or behavioral therapy.   Group support.  Sometimes, surgery may be recommended.  HOME CARE INSTRUCTIONS   Take all medicines as directed by your health care provider.   Lessen stress in your life by relaxing and doing things such as listening to calming music.   Exercise or be active as directed by your health care provider.   Eat a healthy diet and include things such as vegetables, fruits, fish, and lean meats in your diet.   Keep all follow-up appointments with your health care provider.   Attend a support group with others suffering from chronic pain. SEEK MEDICAL CARE IF:   Your pain gets worse.   You develop a new pain that was not there before.   You cannot tolerate medicines given to you by your health  care provider.   You have new symptoms since your last visit with your health care provider.  SEEK IMMEDIATE MEDICAL CARE IF:   You feel weak.   You have decreased sensation or numbness.   You lose control of bowel or bladder function.   Your pain suddenly gets much worse.   You develop shaking.  You develop chills.  You develop confusion.  You develop chest pain.  You develop shortness of breath.  MAKE SURE YOU:  Understand these instructions.  Will watch your condition.  Will get help right away if you are not doing well or get worse.   This information is not intended to replace advice given to you by your health care provider. Make sure you discuss any questions you have with your health care provider.     Document Released: 05/06/2002 Document Revised: 04/16/2013 Document Reviewed: 02/07/2013 Elsevier Interactive Patient Education [employer]

## 2015-07-27 NOTE — ED Notes (Signed)
Nobody is in the room with the patient. The patient is in the room talking to herself. She is carry on full conversation with some invisible person.

## 2015-09-11 DIAGNOSIS — G4700 Insomnia, unspecified: Secondary | ICD-10-CM | POA: Insufficient documentation

## 2016-12-07 DIAGNOSIS — M797 Fibromyalgia: Secondary | ICD-10-CM | POA: Insufficient documentation

## 2017-01-09 DIAGNOSIS — R569 Unspecified convulsions: Secondary | ICD-10-CM | POA: Insufficient documentation

## 2017-11-01 ENCOUNTER — Ambulatory Visit (INDEPENDENT_AMBULATORY_CARE_PROVIDER_SITE_OTHER): Payer: BLUE CROSS/BLUE SHIELD | Admitting: Psychiatry

## 2017-11-01 ENCOUNTER — Encounter (HOSPITAL_COMMUNITY): Payer: Self-pay | Admitting: Psychiatry

## 2017-11-01 VITALS — BP 142/90 | HR 92 | Ht 66.5 in | Wt 193.0 lb

## 2017-11-01 DIAGNOSIS — F5102 Adjustment insomnia: Secondary | ICD-10-CM

## 2017-11-01 DIAGNOSIS — F411 Generalized anxiety disorder: Secondary | ICD-10-CM

## 2017-11-01 DIAGNOSIS — Z62810 Personal history of physical and sexual abuse in childhood: Secondary | ICD-10-CM

## 2017-11-01 DIAGNOSIS — G894 Chronic pain syndrome: Secondary | ICD-10-CM

## 2017-11-01 DIAGNOSIS — F17210 Nicotine dependence, cigarettes, uncomplicated: Secondary | ICD-10-CM | POA: Diagnosis not present

## 2017-11-01 DIAGNOSIS — F39 Unspecified mood [affective] disorder: Secondary | ICD-10-CM

## 2017-11-01 DIAGNOSIS — M797 Fibromyalgia: Secondary | ICD-10-CM

## 2017-11-01 MED ORDER — BUSPIRONE HCL 7.5 MG PO TABS: 7.5000 mg | ORAL_TABLET | Freq: Two times a day (BID) | ORAL | 0 refills | Status: AC | PRN
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
## 2017-11-01 NOTE — Patient Instructions (Signed)
Refer to therapy  Refer to sleep study or sleep medicine

## 2017-12-03 ENCOUNTER — Ambulatory Visit (HOSPITAL_COMMUNITY): Payer: Self-pay | Admitting: Psychiatry

## 2019-11-09 ENCOUNTER — Emergency Department (HOSPITAL_COMMUNITY)
Admission: EM | Admit: 2019-11-09 | Discharge: 2019-11-09 | Disposition: A | Discharge status: Home / Self Care | Payer: BLUE CROSS/BLUE SHIELD | Attending: Emergency Medicine | Admitting: Emergency Medicine

## 2019-11-09 ENCOUNTER — Emergency Department (HOSPITAL_COMMUNITY): Payer: BLUE CROSS/BLUE SHIELD

## 2019-11-09 ENCOUNTER — Encounter (HOSPITAL_COMMUNITY): Payer: Self-pay | Admitting: Emergency Medicine

## 2019-11-09 DIAGNOSIS — F4325 Adjustment disorder with mixed disturbance of emotions and conduct: Secondary | ICD-10-CM

## 2019-11-09 DIAGNOSIS — F1310 Sedative, hypnotic or anxiolytic abuse, uncomplicated: Secondary | ICD-10-CM | POA: Insufficient documentation

## 2019-11-09 DIAGNOSIS — R4182 Altered mental status, unspecified: Secondary | ICD-10-CM | POA: Insufficient documentation

## 2019-11-09 DIAGNOSIS — Z046 Encounter for general psychiatric examination, requested by authority: Secondary | ICD-10-CM | POA: Diagnosis present

## 2019-11-09 DIAGNOSIS — F17210 Nicotine dependence, cigarettes, uncomplicated: Secondary | ICD-10-CM | POA: Diagnosis not present

## 2019-11-09 DIAGNOSIS — R451 Restlessness and agitation: Secondary | ICD-10-CM | POA: Insufficient documentation

## 2019-11-09 DIAGNOSIS — F1994 Other psychoactive substance use, unspecified with psychoactive substance-induced mood disorder: Secondary | ICD-10-CM | POA: Diagnosis not present

## 2019-11-09 DIAGNOSIS — F6381 Intermittent explosive disorder: Secondary | ICD-10-CM | POA: Insufficient documentation

## 2019-11-09 DIAGNOSIS — Z20822 Contact with and (suspected) exposure to covid-19: Secondary | ICD-10-CM | POA: Diagnosis not present

## 2019-11-09 LAB — CBG MONITORING, ED: Glucose-Capillary: 104 mg/dL — ABNORMAL HIGH (ref 70–99)

## 2019-11-09 LAB — RAPID URINE DRUG SCREEN, HOSP PERFORMED
Amphetamines: NOT DETECTED
Barbiturates: NOT DETECTED
Benzodiazepines: POSITIVE — AB
Cocaine: NOT DETECTED
Opiates: NOT DETECTED
Tetrahydrocannabinol: NOT DETECTED

## 2019-11-09 LAB — CBC WITH DIFFERENTIAL/PLATELET
Abs Immature Granulocytes: 0.01 10*3/uL (ref 0.00–0.07)
Basophils Absolute: 0 10*3/uL (ref 0.0–0.1)
Basophils Relative: 1 %
Eosinophils Absolute: 0.1 10*3/uL (ref 0.0–0.5)
Eosinophils Relative: 3 %
HCT: 41.5 % (ref 36.0–46.0)
Hemoglobin: 13.4 g/dL (ref 12.0–15.0)
Immature Granulocytes: 0 %
Lymphocytes Relative: 37 %
Lymphs Abs: 1.7 10*3/uL (ref 0.7–4.0)
MCH: 30.1 pg (ref 26.0–34.0)
MCHC: 32.3 g/dL (ref 30.0–36.0)
MCV: 93.3 fL (ref 80.0–100.0)
Monocytes Absolute: 0.3 10*3/uL (ref 0.1–1.0)
Monocytes Relative: 7 %
Neutro Abs: 2.5 10*3/uL (ref 1.7–7.7)
Neutrophils Relative %: 52 %
Platelets: 192 10*3/uL (ref 150–400)
RBC: 4.45 MIL/uL (ref 3.87–5.11)
RDW: 12.3 % (ref 11.5–15.5)
WBC: 4.7 10*3/uL (ref 4.0–10.5)
nRBC: 0 % (ref 0.0–0.2)

## 2019-11-09 LAB — COMPREHENSIVE METABOLIC PANEL
ALT: 22 U/L (ref 0–44)
AST: 20 U/L (ref 15–41)
Albumin: 3.9 g/dL (ref 3.5–5.0)
Alkaline Phosphatase: 83 U/L (ref 38–126)
Anion gap: 9 (ref 5–15)
BUN: 14 mg/dL (ref 6–20)
CO2: 29 mmol/L (ref 22–32)
Calcium: 9.3 mg/dL (ref 8.9–10.3)
Chloride: 106 mmol/L (ref 98–111)
Creatinine, Ser: 0.8 mg/dL (ref 0.44–1.00)
GFR calc Af Amer: >60 mL/min (ref >60)
GFR calc non Af Amer: >60 mL/min (ref >60)
Glucose, Bld: 113 mg/dL — ABNORMAL HIGH (ref 70–99)
Potassium: 3.9 mmol/L (ref 3.5–5.1)
Sodium: 144 mmol/L (ref 135–145)
Total Bilirubin: 0.3 mg/dL (ref 0.3–1.2)
Total Protein: 6.8 g/dL (ref 6.5–8.1)

## 2019-11-09 LAB — ACETAMINOPHEN LEVEL: Acetaminophen (Tylenol), Serum: <10 ug/mL — ABNORMAL LOW (ref 10–30)

## 2019-11-09 LAB — RESPIRATORY PANEL BY RT PCR (FLU A&B, COVID)
Influenza A by PCR: NEGATIVE
Influenza B by PCR: NEGATIVE
SARS Coronavirus 2 by RT PCR: NEGATIVE

## 2019-11-09 LAB — I-STAT BETA HCG BLOOD, ED (MC, WL, AP ONLY): I-stat hCG, quantitative: <5 m[IU]/mL (ref <5)

## 2019-11-09 LAB — ETHANOL: Alcohol, Ethyl (B): <10 mg/dL (ref <10)

## 2019-11-09 LAB — SALICYLATE LEVEL: Salicylate Lvl: <7 mg/dL — ABNORMAL LOW (ref 7.0–30.0)

## 2019-11-09 MED ORDER — ALUM & MAG HYDROXIDE-SIMETH 200-200-20 MG/5ML PO SUSP: 30.0000 mL | Freq: Four times a day (QID) | ORAL | Status: DC | PRN

## 2019-11-09 MED ORDER — VORTIOXETINE HBR 5 MG PO TABS
20.0000 mg | ORAL_TABLET | Freq: Every day | ORAL | Status: DC
  Filled 2019-11-09: qty 4

## 2019-11-09 MED ORDER — STERILE WATER FOR INJECTION IJ SOLN
INTRAMUSCULAR | Status: AC
  Administered 2019-11-09: 03:00:00 2 mL
  Filled 2019-11-09: qty 10

## 2019-11-09 MED ORDER — ONDANSETRON HCL 4 MG PO TABS: 4.0000 mg | ORAL_TABLET | Freq: Three times a day (TID) | ORAL | Status: DC | PRN

## 2019-11-09 MED ORDER — ZIPRASIDONE MESYLATE 20 MG IM SOLR
INTRAMUSCULAR | Status: AC
  Administered 2019-11-09: 20 mg
  Filled 2019-11-09: qty 20

## 2019-11-09 MED ORDER — GABAPENTIN 400 MG PO CAPS: 800.0000 mg | ORAL_CAPSULE | Freq: Four times a day (QID) | ORAL | Status: DC

## 2019-11-09 MED ORDER — ACETAMINOPHEN 325 MG PO TABS: 650.0000 mg | ORAL_TABLET | ORAL | Status: DC | PRN
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
## 2019-11-09 NOTE — ED Notes (Addendum)
TTS waiting to speak with pt . Pt asleep right now
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
## 2019-11-09 NOTE — Care Management (Signed)
ED CM spoke with patient concerning follow up for anxiety, patient will discuss with her PCP and get a referral, no further TOC needs identified.
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
## 2019-11-09 NOTE — ED Notes (Signed)
Meal tray provided at bedside
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
## 2019-11-09 NOTE — BH Assessment (Signed)
Received TTS consult request. RN reports Pt has received medication and is unable to participate in assessment at this time.   Pamalee Leyden, [facility], [facility] Triage Specialist [phone]

## 2021-08-06 IMAGING — CT CT HEAD W/O CM
3 series · 15 of 47 positions shown, 18 images · non-contrast
Comparison: None.

CLINICAL DATA: Hit head

EXAM:
CT HEAD WITHOUT CONTRAST
CT CERVICAL SPINE WITHOUT CONTRAST
TECHNIQUE: Multidetector CT imaging of the head and cervical spine was
performed following the standard protocol without intravenous
contrast. Multiplanar CT image reconstructions of the cervical spine
were also generated.

[Series 3: head wo · axial · 0.47mm/px · z∈[-140,+10]mm · 9 of 36 slices shown, 12 images]
[im 3/36  brain]
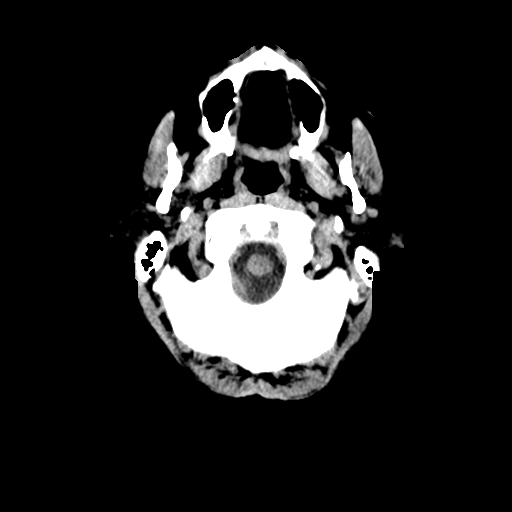
[im 3/36  bone]
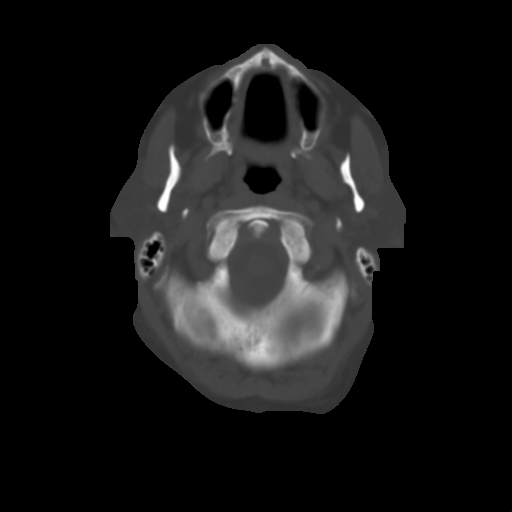
[im 7/36  brain]
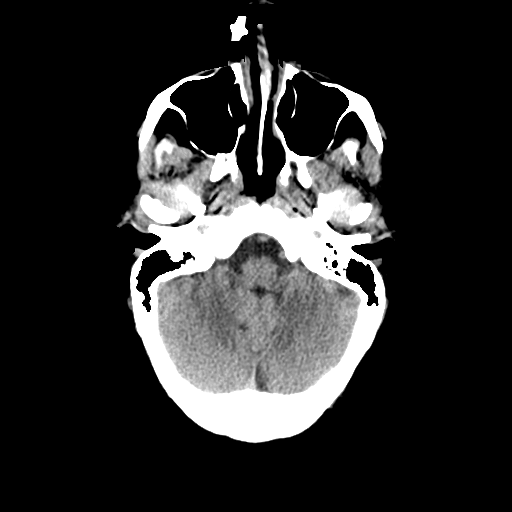
[im 10/36  brain]
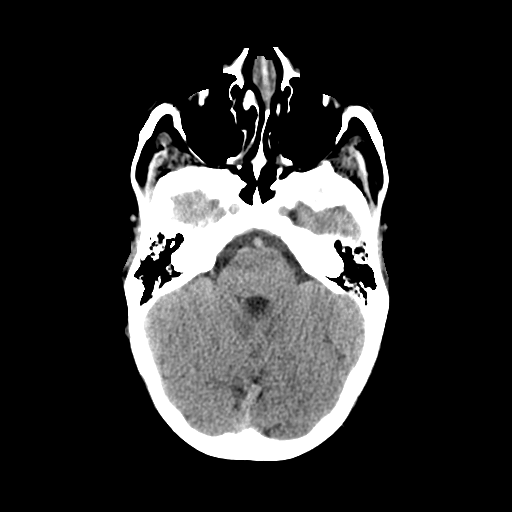
[im 14/36  brain]
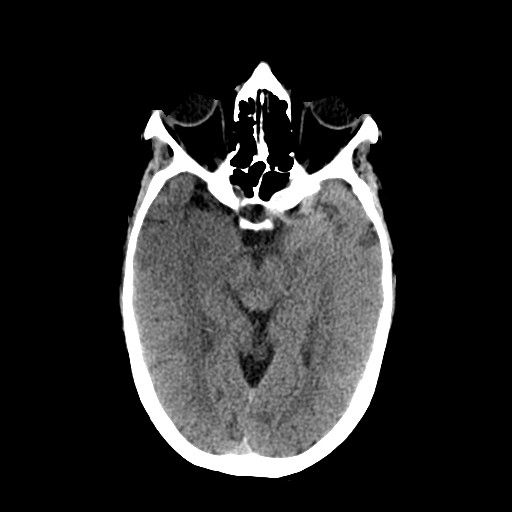
[im 19/36  brain]
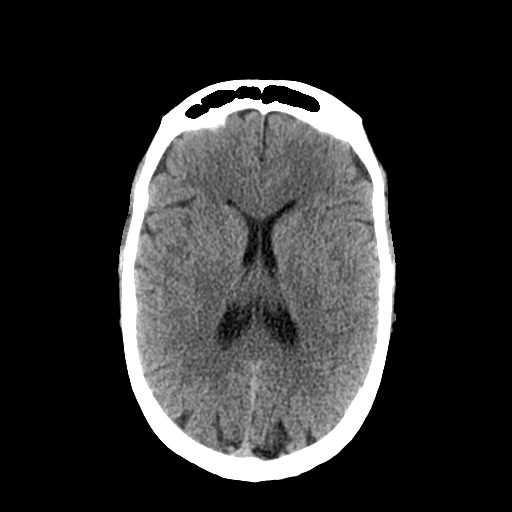
[im 19/36  bone]
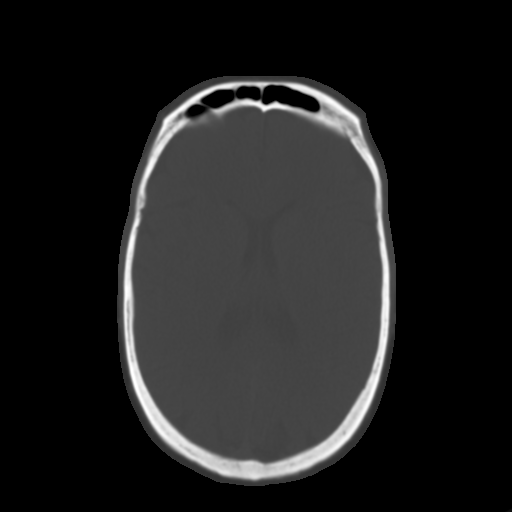
[im 22/36  brain]
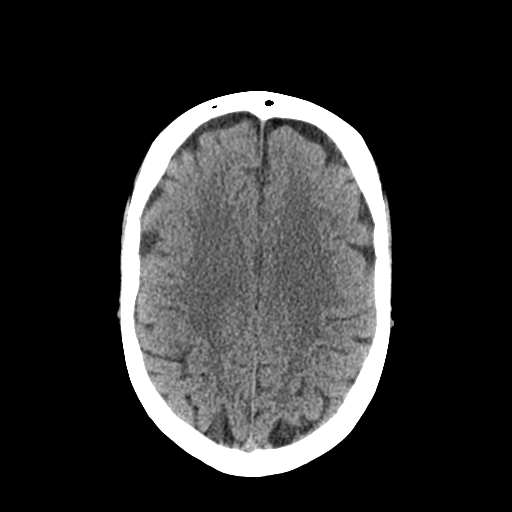
[im 26/36  brain]
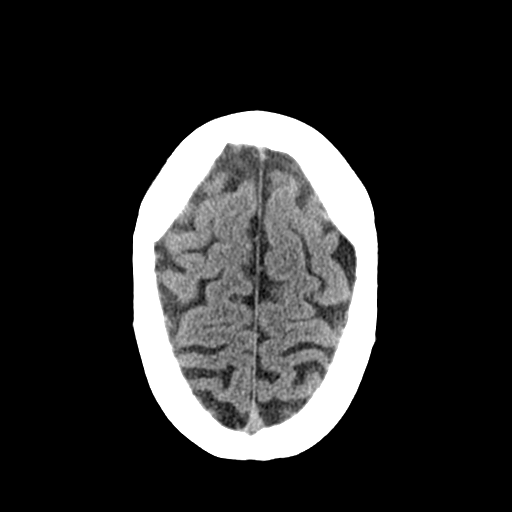
[im 29/36  brain]
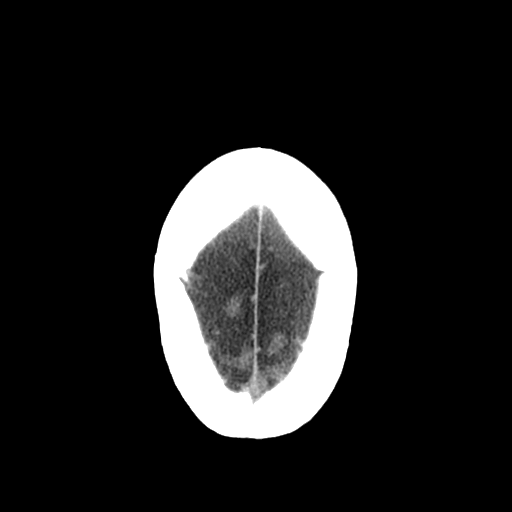
[im 33/36  brain]
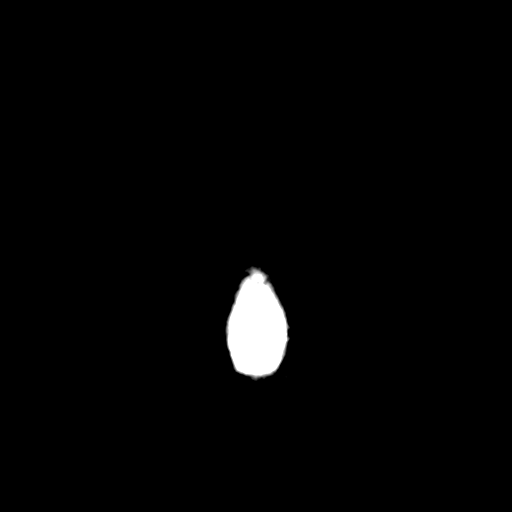
[im 33/36  bone]
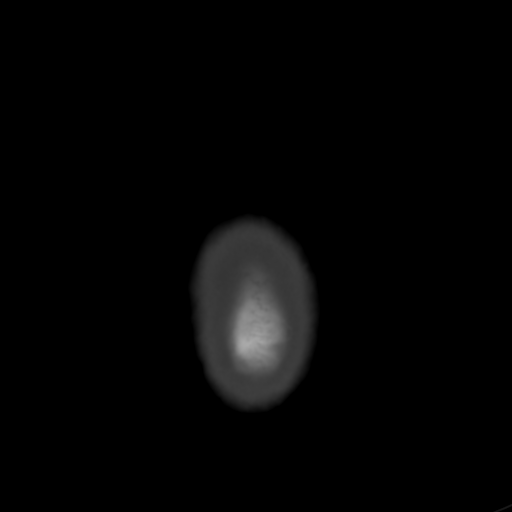

[Series 5: coronal soft tissue · coronal · 0.30mm/px · 3 of 75 slices shown]
[im 25/75  brain]
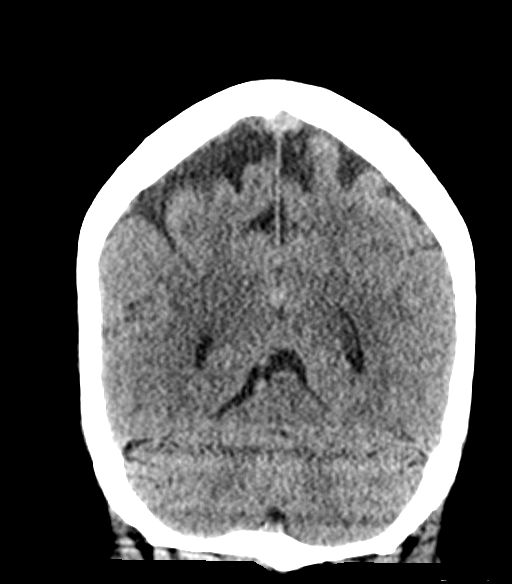
[im 33/75  brain]
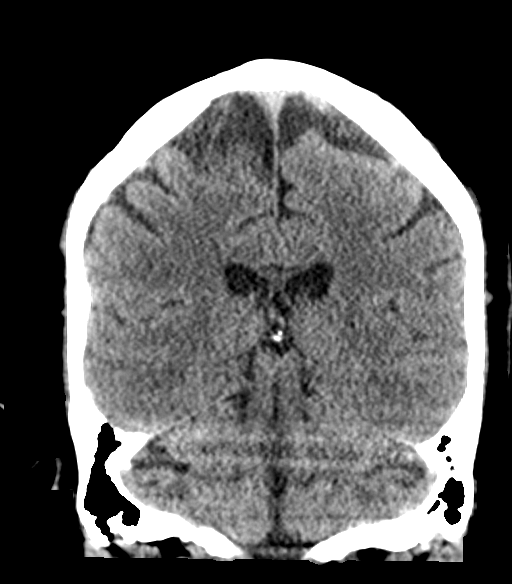
[im 42/75  brain]
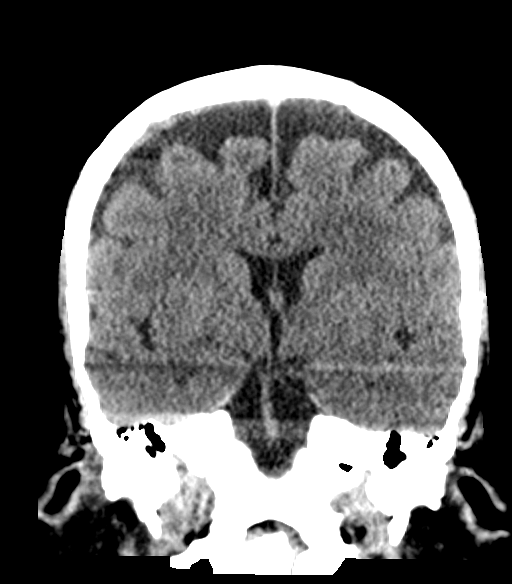

[Series 6: sagittal soft tissue · sagittal · 0.34mm/px · 3 of 56 slices shown]
[im 19/56  brain]
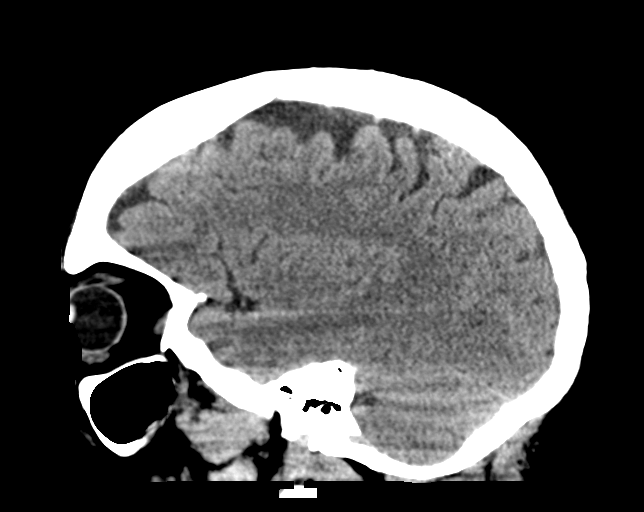
[im 28/56  brain]
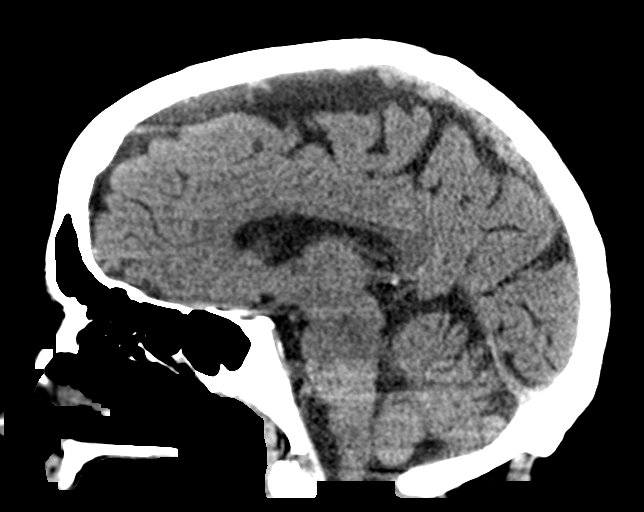
[im 37/56  brain]
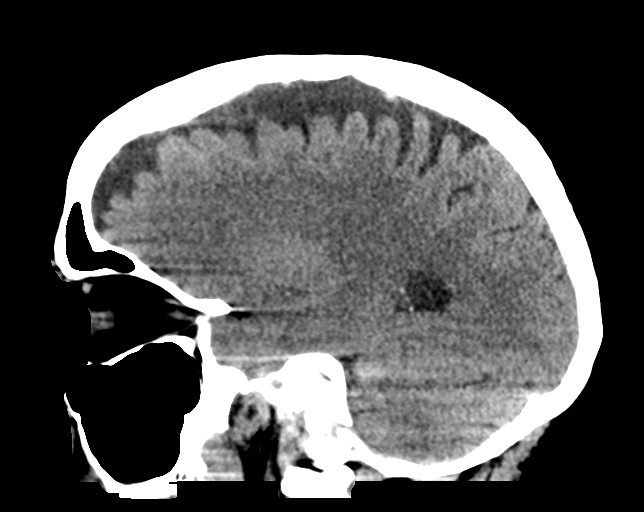

[15 of 47 positions shown; findings below may reference images not displayed]

FINDINGS: CT HEAD FINDINGS

Brain: No evidence of acute infarction, hemorrhage, hydrocephalus,
extra-axial collection or mass lesion/mass effect.

Mild cortical atrophy.

Vascular: No hyperdense vessel or unexpected calcification.

Skull: Normal. Negative for fracture or focal lesion.

Sinuses/Orbits: The visualized paranasal sinuses are essentially
clear. The mastoid air cells are unopacified.

Other: None.

CT CERVICAL SPINE FINDINGS

Alignment: Normal cervical lordosis.

Skull base and vertebrae: No acute fracture. No primary bone lesion
or focal pathologic process.

Soft tissues and spinal canal: No prevertebral fluid or swelling. No
visible canal hematoma.

Disc levels: Mild degenerative changes of the mid cervical spine.
Spinal canal is patent.

Upper chest: Visualized lung apices are essentially clear.

Other: /thyroid is unremarkable.
IMPRESSION: No evidence of acute intracranial abnormality.

No evidence of traumatic injury to the cervical spine.
